# Patient Record
Sex: Female | Born: 2000
Health system: Southern US, Academic
[De-identification: ages and names within clinical notes are randomized; demographics above are authoritative.]

## PROBLEM LIST (undated history)

## (undated) ENCOUNTER — Ambulatory Visit

## (undated) ENCOUNTER — Ambulatory Visit: Payer: MEDICARE | Attending: Optometrist | Primary: Optometrist

## (undated) ENCOUNTER — Encounter: Attending: Dermatology | Primary: Dermatology

## (undated) ENCOUNTER — Ambulatory Visit: Payer: MEDICARE

## (undated) ENCOUNTER — Telehealth

## (undated) ENCOUNTER — Ambulatory Visit: Attending: Family Medicine | Primary: Family Medicine

## (undated) ENCOUNTER — Encounter: Payer: MEDICARE | Attending: Dermatology | Primary: Dermatology

## (undated) ENCOUNTER — Encounter: Attending: Family Medicine | Primary: Family Medicine

## (undated) ENCOUNTER — Encounter: Payer: MEDICARE | Attending: Pediatrics | Primary: Pediatrics

## (undated) ENCOUNTER — Encounter: Payer: MEDICARE | Attending: Family Medicine | Primary: Family Medicine

## (undated) ENCOUNTER — Encounter

## (undated) ENCOUNTER — Ambulatory Visit: Payer: MEDICARE | Attending: Family | Primary: Family

## (undated) ENCOUNTER — Encounter: Attending: Family | Primary: Family

## (undated) ENCOUNTER — Encounter: Payer: MEDICARE | Attending: Obstetrics & Gynecology | Primary: Obstetrics & Gynecology

## (undated) ENCOUNTER — Ambulatory Visit: Payer: MEDICARE | Attending: Pediatrics | Primary: Pediatrics

## (undated) ENCOUNTER — Encounter: Payer: MEDICARE | Attending: Optometrist | Primary: Optometrist

## (undated) ENCOUNTER — Ambulatory Visit: Payer: MEDICARE | Attending: Dermatology | Primary: Dermatology

## (undated) DIAGNOSIS — F79 Unspecified intellectual disabilities: Secondary | ICD-10-CM

## (undated) HISTORY — PX: NO PAST SURGERIES: SHX2092

---
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
## 2019-09-12 ENCOUNTER — Ambulatory Visit: Admit: 2019-09-12 | Discharge: 2019-09-13 | Payer: MEDICARE | Attending: Family Medicine | Primary: Family Medicine

## 2019-09-12 DIAGNOSIS — R5383 Other fatigue: Principal | ICD-10-CM

## 2019-09-13 MED ORDER — FERROUS SULFATE 325 MG (65 MG IRON) TABLET
ORAL_TABLET | ORAL | 3 refills | 90 days | Status: CP
Start: 2019-09-13 — End: 2020-09-12

## 2019-09-29 ENCOUNTER — Encounter: Admit: 2019-09-29 | Discharge: 2019-09-30 | Payer: MEDICARE | Attending: Family Medicine | Primary: Family Medicine

## 2019-12-11 ENCOUNTER — Encounter: Payer: Self-pay | Admitting: Emergency Medicine

## 2019-12-11 ENCOUNTER — Other Ambulatory Visit: Payer: Self-pay

## 2019-12-11 ENCOUNTER — Ambulatory Visit
Admission: EM | Admit: 2019-12-11 | Discharge: 2019-12-11 | Disposition: A | Discharge status: Home / Self Care | Payer: Medicare Other | Attending: Emergency Medicine | Admitting: Emergency Medicine

## 2019-12-11 DIAGNOSIS — S0501XA Injury of conjunctiva and corneal abrasion without foreign body, right eye, initial encounter: Secondary | ICD-10-CM

## 2019-12-11 MED ORDER — ERYTHROMYCIN 5 MG/GM OP OINT: TOPICAL_OINTMENT | Freq: Four times a day (QID) | OPHTHALMIC | 0 refills | Status: AC

## 2019-12-11 NOTE — ED Provider Notes (Signed)
[facility] [place] [facility] [place]   Name: Miranda Jenkins DOB: 18-Apr-2001 MRN: [id number] CSN: [id number] PCP: Patient, No Pcp Per  Arrival date and time:  12/11/19 4259  Chief Complaint:  Eye Injury (right)   NOTE: Prior to seeing the patient today, I have reviewed the triage nursing documentation and vital signs. Clinical staff has updated patient's PMH/PSHx, current medication list, and drug allergies/intolerances to ensure comprehensive history available to assist in medical decision making.   History:   HPI: Miranda Jenkins is a 19 y.o. female who presents today with complaints of right eye pain.  Patient was at the nail salon getting her nails done and when she went to wash her hands, she accidentally scratched her right eye with her sharp fingernail.  She immediately felt pain to her that I and noticed the swelling to her eyelid continue throughout the day.  She is also noticed some small purulent drainage to that I and occasional blurred vision.  She denies any loss of vision or headaches.   History reviewed. No pertinent past medical history.  Past Surgical History:  Procedure Laterality Date   NO PAST SURGERIES      Family History  Problem Relation Age of Onset   Other Mother        unknown medical history   Other Father        unknown medical history    Social History   Tobacco Use   Smoking status: Never Smoker   Smokeless tobacco: Never Used  Vaping Use   Vaping Use: Never used  Substance Use Topics   Alcohol use: Never   Drug use: Never    There are no problems to display for this patient.   Home Medications:    No outpatient medications have been marked as taking for the 12/11/19 encounter [facility] Encounter).    Allergies:   Shellfish allergy  Review of Systems (ROS): Review of Systems  Eyes: Positive for pain and discharge. Negative for photophobia and visual disturbance.  All other systems reviewed and are negative.    Vital  Signs: Today's Vitals   12/11/19 0955  BP: (!) 131/93  Pulse: 93  Resp: 18  Temp: 98.2 F (36.8 C)  TempSrc: Oral  SpO2: 100%  Weight: 109 lb (49.4 kg)  Height: 5\' 3"  (1.6 m)  PainSc: 0-No pain    Physical Exam: Physical Exam Vitals and nursing note reviewed.  Constitutional:      Appearance: Normal appearance.  Eyes:     General:        Right eye: Discharge present. No foreign body.     Extraocular Movements: Extraocular movements intact.     Conjunctiva/sclera:     Right eye: Right conjunctiva is injected. No hemorrhage.  Cardiovascular:     Rate and Rhythm: Normal rate and regular rhythm.     Pulses: Normal pulses.     Heart sounds: Normal heart sounds.  Pulmonary:     Breath sounds: Normal breath sounds.  Skin:    General: Skin is warm and dry.  Neurological:     Mental Status: She is alert.  Psychiatric:        Mood and Affect: Mood normal.        Behavior: Behavior normal.        Thought Content: Thought content normal.      Urgent Care Treatments / Results:   LABS: PLEASE NOTE: all labs that were ordered this encounter are listed, however only abnormal results  are displayed. Labs Reviewed - No data to display  EKG: -None  RADIOLOGY: No results found.  PROCEDURES: Procedures  MEDICATIONS RECEIVED THIS VISIT: Medications - No data to display  PERTINENT CLINICAL COURSE NOTES/UPDATES:   Initial Impression / Assessment and Plan / Urgent Care Course:  Pertinent labs & imaging results that were available during my care of the patient were personally reviewed by me and considered in my medical decision making (see lab/imaging section of note for values and interpretations).  Miranda Jenkins is a 19 y.o. female who presents to [facility] today with complaints of right eye pain, diagnosed with right corneal abrasion, and treated as such with the medications below. NP and patient reviewed discharge instructions below during visit.   Patient is  well appearing overall in clinic today. She does not appear to be in any acute distress. Presenting symptoms (see HPI) and exam as documented above.   I have reviewed the follow up and strict return precautions for any new or worsening symptoms. Patient is aware of symptoms that would be deemed urgent/emergent, and would thus require further evaluation either here or in the emergency department. At the time of discharge, she verbalized understanding and consent with the discharge plan as it was reviewed with her. All questions were fielded by provider and/or clinic staff prior to patient discharge.    Final Clinical Impressions / Urgent Care Diagnoses:   Final diagnoses:  Abrasion of right cornea, initial encounter    New Prescriptions:  [place] Controlled Substance Registry consulted? Not Applicable  Meds ordered this encounter  Medications   erythromycin ophthalmic ointment    Sig: Place into the right eye 4 (four) times daily for 7 days. Place a 1/2 inch ribbon of ointment into the lower eyelid.    Dispense:  1 g    Refill:  0      Discharge Instructions     You were seen for eye pain and are being treated for a corneal abrasion.   Take your medications as prescribed.  Make sure you follow-up if your symptoms do not resolve or if they worsen.  Keep your eye clean and do not touch your eye if possible.  --- Take care, Dr. Sharlet Salina, NP-c     Recommended Follow up Care:  Patient encouraged to follow up with the following provider within the specified time frame, or sooner as dictated by the severity of her symptoms. As always, she was instructed that for any urgent/emergent care needs, she should seek care either here or in the emergency department for more immediate evaluation.   Bailey Mech, DNP, NP-c    Bailey Mech, NP 12/11/19 1026

## 2019-12-11 NOTE — ED Triage Notes (Signed)
Patient in today c/o right eye injury yesterday when she poked herself in her eye with her fingernail.

## 2019-12-11 NOTE — Discharge Instructions (Signed)
You were seen for eye pain and are being treated for a corneal abrasion.   Take your medications as prescribed.  Make sure you follow-up if your symptoms do not resolve or if they worsen.  Keep your eye clean and do not touch your eye if possible.  --- Take care, Dr. Sharlet Salina, NP-c

## 2021-01-22 ENCOUNTER — Emergency Department: Payer: Medicare Other

## 2021-01-22 ENCOUNTER — Emergency Department
Admission: EM | Admit: 2021-01-22 | Discharge: 2021-01-22 | Disposition: A | Discharge status: Home / Self Care | Payer: Medicare Other | Attending: Emergency Medicine | Admitting: Emergency Medicine

## 2021-01-22 ENCOUNTER — Other Ambulatory Visit: Payer: Self-pay

## 2021-01-22 DIAGNOSIS — S8991XA Unspecified injury of right lower leg, initial encounter: Secondary | ICD-10-CM | POA: Diagnosis present

## 2021-01-22 DIAGNOSIS — X509XXA Other and unspecified overexertion or strenuous movements or postures, initial encounter: Secondary | ICD-10-CM | POA: Insufficient documentation

## 2021-01-22 DIAGNOSIS — S83004A Unspecified dislocation of right patella, initial encounter: Secondary | ICD-10-CM | POA: Insufficient documentation

## 2021-01-22 DIAGNOSIS — Y92009 Unspecified place in unspecified non-institutional (private) residence as the place of occurrence of the external cause: Secondary | ICD-10-CM | POA: Insufficient documentation

## 2021-01-22 NOTE — ED Triage Notes (Signed)
Patient presents from home after sitting on the bed and her right knee "popped" out of place. Pt received of fentanyl PTA by EMS Patient denies any other sx at this time.

## 2021-01-22 NOTE — ED Provider Notes (Signed)
[facility]-EMERGENCY DEPARTMENT  ____________________________________________  Time seen: Approximately 10:37 PM  I have reviewed the triage vital signs and the nursing notes.   HISTORY  Chief Complaint Knee Pain (right)   Historian Patient     HPI Miranda Jenkins is a 20 y.o. female presents to the emergency department with acute right knee pain.  Patient reports that she went to sit on her bed and her kneecap popped out of place.  Patient denies ever needing patella reduction in the past.  No other trauma months reported at home.  No other alleviating measures have been attempted.   No past medical history on file.   Immunizations up to date:  Yes.     No past medical history on file.  There are no problems to display for this patient.   Past Surgical History:  Procedure Laterality Date   NO PAST SURGERIES      Prior to Admission medications   Not on File    Allergies Shellfish allergy  Family History  Problem Relation Age of Onset   Other Mother        unknown medical history   Other Father        unknown medical history    Social History Social History   Tobacco Use   Smoking status: Never   Smokeless tobacco: Never  Vaping Use   Vaping Use: Never used  Substance Use Topics   Alcohol use: Never   Drug use: Never     Review of Systems  Constitutional: No fever/chills Eyes:  No discharge ENT: No upper respiratory complaints. Respiratory: no cough. No SOB/ use of accessory muscles to breath Gastrointestinal:   No nausea, no vomiting.  No diarrhea.  No constipation. Musculoskeletal: Patient has right knee pain.  Skin: Negative for rash, abrasions, lacerations, ecchymosis.    ____________________________________________   PHYSICAL EXAM:  VITAL SIGNS: ED Triage Vitals  Enc Vitals Group     BP 01/22/21 2227 131/85     Pulse Rate 01/22/21 2227 84     Resp 01/22/21 2227 18     Temp 01/22/21 2227 98.2 F (36.8 C)     Temp Source  01/22/21 2227 Oral     SpO2 01/22/21 2227 100 %     Weight 01/22/21 2226 115 lb (52.2 kg)     Height 01/22/21 2226 5\' 3"  (1.6 m)     Head Circumference --      Peak Flow --      Pain Score 01/22/21 2227 0     Pain Loc --      Pain Edu? --      Excl. in GC? --      Constitutional: Alert and oriented. Well appearing and in no acute distress. Eyes: Conjunctivae are normal. PERRL. EOMI. Head: Atraumatic. ENT: Cardiovascular: Normal rate, regular rhythm. Normal S1 and S2.  Good peripheral circulation. Respiratory: Normal respiratory effort without tachypnea or retractions. Lungs CTAB. Good air entry to the bases with no decreased or absent breath sounds Gastrointestinal: Bowel sounds x 4 quadrants. Soft and nontender to palpation. No guarding or rigidity. No distention. Musculoskeletal: Full range of motion to all extremities.  Patient had right knee deformity with evidence of dislocated patella.  Patella was easily reducible.  Palpable dorsalis pedis pulse bilaterally and symmetrically.  Capillary refill less than 2 seconds on the right. Neurologic:  Normal for age. No gross focal neurologic deficits are appreciated.  Skin:  Skin is warm, dry and intact. No rash noted. Psychiatric: Mood  and affect are normal for age. Speech and behavior are normal.   ____________________________________________   LABS (all labs ordered are listed, but only abnormal results are displayed)  Labs Reviewed - No data to display ____________________________________________  EKG   ____________________________________________  RADIOLOGY Geraldo Pitter, personally viewed and evaluated these images (plain radiographs) as part of my medical decision making, as well as reviewing the written report by the radiologist.    DG Knee Complete 4 Views Right  Result Date: 01/22/2021 CLINICAL DATA:  Dislocated patella.  Post reduction films. EXAM: RIGHT KNEE - COMPLETE 4+ VIEW COMPARISON:  None. FINDINGS:  Normal alignment and joint spaces. Patella appears normally located on provided views. No evidence of fracture. There may be a small knee joint effusion. Mild anterior soft tissue edema. IMPRESSION: 1. No fracture.  Normal alignment. 2. Small knee joint effusion with anterior soft tissue edema. Electronically Signed   By: Narda Rutherford M.D.   On: 01/22/2021 22:45    ____________________________________________    PROCEDURES  Procedure(s) performed:     Reduction of dislocation  Date/Time: 01/22/2021 10:39 PM Performed by: Orvil Feil, PA-C Authorized by: Orvil Feil, PA-C  Consent: Verbal consent obtained. Written consent obtained. Risks and benefits: risks, benefits and alternatives were discussed Consent given by: patient Patient understanding: patient states understanding of the procedure being performed Patient identity confirmed: verbally with patient Time out: Immediately prior to procedure a "time out" was called to verify the correct patient, procedure, equipment, support staff and site/side marked as required. Local anesthesia used: no  Anesthesia: Local anesthesia used: no  Sedation: Patient sedated: no  Patient tolerance: patient tolerated the procedure well with no immediate complications       Medications - No data to display   ____________________________________________   INITIAL IMPRESSION / ASSESSMENT AND PLAN / ED COURSE  Pertinent labs & imaging results that were available during my care of the patient were reviewed by me and considered in my medical decision making (see chart for details).    Assessment and Plan:  Patella dislocation 20 year old female presents to the emergency department with right knee pain and evidence of a dislocated patella.  Patella was easily reduced in the emergency department.  Patient was placed in a knee immobilizer and crutches were provided.  Tylenol and ibuprofen alternating were recommended for  discomfort.  Patient was advised to follow-up with orthopedics, Dr. Rosita Kea.     ____________________________________________  FINAL CLINICAL IMPRESSION(S) / ED DIAGNOSES  Final diagnoses:  Dislocation of right patella, initial encounter      NEW MEDICATIONS STARTED DURING THIS VISIT:  ED Discharge Orders     None           This chart was dictated using voice recognition software/Dragon. Despite best efforts to proofread, errors can occur which can change the meaning. Any change was purely unintentional.     Orvil Feil, PA-C 01/22/21 2259    Willy Eddy, MD 01/23/21 1041

## 2021-01-22 NOTE — Discharge Instructions (Addendum)
You can take Tylenol and ibuprofen alternating for pain.

## 2021-01-22 NOTE — ED Notes (Signed)
E-signature pad unavailable - Pt verbalized understanding of D/C information - no additional concerns at this time.  

## 2021-10-17 ENCOUNTER — Ambulatory Visit: Admit: 2021-10-17 | Discharge: 2021-10-18 | Payer: MEDICARE

## 2021-10-17 DIAGNOSIS — Z0000 Encounter for general adult medical examination without abnormal findings: Principal | ICD-10-CM

## 2021-10-17 DIAGNOSIS — Z130 Encounter for screening for diseases of the blood and blood-forming organs and certain disorders involving the immune mechanism: Principal | ICD-10-CM

## 2021-10-17 DIAGNOSIS — Z13220 Encounter for screening for lipoid disorders: Principal | ICD-10-CM

## 2021-10-17 DIAGNOSIS — L309 Dermatitis, unspecified: Principal | ICD-10-CM

## 2021-10-17 DIAGNOSIS — Z87828 Personal history of other (healed) physical injury and trauma: Principal | ICD-10-CM

## 2021-10-17 DIAGNOSIS — Z1329 Encounter for screening for other suspected endocrine disorder: Principal | ICD-10-CM

## 2022-01-01 ENCOUNTER — Encounter: Payer: Self-pay | Admitting: Emergency Medicine

## 2022-01-01 ENCOUNTER — Other Ambulatory Visit: Payer: Self-pay

## 2022-01-01 ENCOUNTER — Emergency Department
Admission: EM | Admit: 2022-01-01 | Discharge: 2022-01-01 | Disposition: A | Discharge status: Home / Self Care | Payer: Medicare Other | Attending: Emergency Medicine | Admitting: Emergency Medicine

## 2022-01-01 DIAGNOSIS — Z046 Encounter for general psychiatric examination, requested by authority: Secondary | ICD-10-CM | POA: Diagnosis present

## 2022-01-01 DIAGNOSIS — F4389 Other reactions to severe stress: Secondary | ICD-10-CM | POA: Diagnosis not present

## 2022-01-01 DIAGNOSIS — F430 Acute stress reaction: Secondary | ICD-10-CM | POA: Diagnosis not present

## 2022-01-01 DIAGNOSIS — I10 Essential (primary) hypertension: Secondary | ICD-10-CM | POA: Insufficient documentation

## 2022-01-01 DIAGNOSIS — Z20822 Contact with and (suspected) exposure to covid-19: Secondary | ICD-10-CM | POA: Diagnosis not present

## 2022-01-01 DIAGNOSIS — R4588 Nonsuicidal self-harm: Secondary | ICD-10-CM | POA: Diagnosis present

## 2022-01-01 DIAGNOSIS — F69 Unspecified disorder of adult personality and behavior: Secondary | ICD-10-CM

## 2022-01-01 HISTORY — DX: Unspecified intellectual disabilities: F79

## 2022-01-01 LAB — URINE DRUG SCREEN, QUALITATIVE (ARMC ONLY)
Amphetamines, Ur Screen: NOT DETECTED
Barbiturates, Ur Screen: NOT DETECTED
Benzodiazepine, Ur Scrn: NOT DETECTED
Cannabinoid 50 Ng, Ur ~~LOC~~: NOT DETECTED
Cocaine Metabolite,Ur ~~LOC~~: NOT DETECTED
MDMA (Ecstasy)Ur Screen: NOT DETECTED
Methadone Scn, Ur: NOT DETECTED
Opiate, Ur Screen: NOT DETECTED
Phencyclidine (PCP) Ur S: NOT DETECTED
Tricyclic, Ur Screen: NOT DETECTED

## 2022-01-01 LAB — CBC
HCT: 40.5 % (ref 36.0–46.0)
Hemoglobin: 12.5 g/dL (ref 12.0–15.0)
MCH: 24.6 pg — ABNORMAL LOW (ref 26.0–34.0)
MCHC: 30.9 g/dL (ref 30.0–36.0)
MCV: 79.6 fL — ABNORMAL LOW (ref 80.0–100.0)
Platelets: 278 10*3/uL (ref 150–400)
RBC: 5.09 MIL/uL (ref 3.87–5.11)
RDW: 14.4 % (ref 11.5–15.5)
WBC: 3.9 10*3/uL — ABNORMAL LOW (ref 4.0–10.5)
nRBC: 0 % (ref 0.0–0.2)

## 2022-01-01 LAB — COMPREHENSIVE METABOLIC PANEL
ALT: 12 U/L (ref 0–44)
AST: 17 U/L (ref 15–41)
Albumin: 4.6 g/dL (ref 3.5–5.0)
Alkaline Phosphatase: 41 U/L (ref 38–126)
Anion gap: 6 (ref 5–15)
BUN: 13 mg/dL (ref 6–20)
CO2: 25 mmol/L (ref 22–32)
Calcium: 9.8 mg/dL (ref 8.9–10.3)
Chloride: 108 mmol/L (ref 98–111)
Creatinine, Ser: 0.46 mg/dL (ref 0.44–1.00)
GFR, Estimated: >60 mL/min (ref >60)
Glucose, Bld: 86 mg/dL (ref 70–99)
Potassium: 3.9 mmol/L (ref 3.5–5.1)
Sodium: 139 mmol/L (ref 135–145)
Total Bilirubin: 1.1 mg/dL (ref 0.3–1.2)
Total Protein: 8.1 g/dL (ref 6.5–8.1)

## 2022-01-01 LAB — ETHANOL: Alcohol, Ethyl (B): <10 mg/dL (ref <10)

## 2022-01-01 LAB — POC URINE PREG, ED: Preg Test, Ur: NEGATIVE

## 2022-01-01 LAB — RESP PANEL BY RT-PCR (FLU A&B, COVID) ARPGX2
Influenza A by PCR: NEGATIVE
Influenza B by PCR: NEGATIVE
SARS Coronavirus 2 by RT PCR: NEGATIVE
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
## 2022-01-01 NOTE — ED Provider Notes (Signed)
-----------------------------------------   3:52 PM on 01/01/2022 ----------------------------------------- Patient has been seen and evaluated by psychiatry.  Psychiatry believes the patient is safe for discharge home.  Mother will be coming to pick the patient up shortly.  Patient's medical work-up is nonrevealing with a reassuring lab work, negative urine drug screen, negative pregnancy test, negative COVID/flu reassuring CBC and chemistry.   Minna Antis, MD 01/01/22 [phone]
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
## 2022-01-01 NOTE — ED Notes (Signed)
Pt given d/c paperwork and explained about resources. Pt understood, no other needs expressed. Pt discharge signature placed in medical records bin

## 2022-01-01 NOTE — Discharge Instructions (Addendum)
Contact patient's insurance or primary care provider for referrals to appropriate therapists/counseling centers. Another resource is the website  "psychologytoday.com" where they list providers that take your insurance. [employer] [facility] [phone]. You can also return to [facility]
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
## 2022-01-01 NOTE — ED Notes (Signed)
Patient Items:  Black Sandals Pink Underpants Jeans [employer]
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
## 2022-02-14 ENCOUNTER — Emergency Department
Admission: EM | Admit: 2022-02-14 | Discharge: 2022-02-16 | Disposition: A | Discharge status: Home / Self Care | Payer: Medicare Other | Attending: Emergency Medicine | Admitting: Emergency Medicine

## 2022-02-14 ENCOUNTER — Encounter: Payer: Self-pay | Admitting: Emergency Medicine

## 2022-02-14 ENCOUNTER — Other Ambulatory Visit: Payer: Self-pay

## 2022-02-14 DIAGNOSIS — F4325 Adjustment disorder with mixed disturbance of emotions and conduct: Secondary | ICD-10-CM | POA: Diagnosis not present

## 2022-02-14 DIAGNOSIS — Z79899 Other long term (current) drug therapy: Secondary | ICD-10-CM | POA: Diagnosis not present

## 2022-02-14 DIAGNOSIS — F430 Acute stress reaction: Secondary | ICD-10-CM | POA: Diagnosis present

## 2022-02-14 DIAGNOSIS — F3481 Disruptive mood dysregulation disorder: Secondary | ICD-10-CM | POA: Insufficient documentation

## 2022-02-14 DIAGNOSIS — F29 Unspecified psychosis not due to a substance or known physiological condition: Secondary | ICD-10-CM | POA: Insufficient documentation

## 2022-02-14 DIAGNOSIS — Y30XXXA Falling, jumping or pushed from a high place, undetermined intent, initial encounter: Secondary | ICD-10-CM | POA: Diagnosis not present

## 2022-02-14 DIAGNOSIS — S90812A Abrasion, left foot, initial encounter: Secondary | ICD-10-CM

## 2022-02-14 DIAGNOSIS — Z20822 Contact with and (suspected) exposure to covid-19: Secondary | ICD-10-CM | POA: Insufficient documentation

## 2022-02-14 DIAGNOSIS — S99922A Unspecified injury of left foot, initial encounter: Secondary | ICD-10-CM | POA: Diagnosis present

## 2022-02-14 LAB — COMPREHENSIVE METABOLIC PANEL
ALT: 11 U/L (ref 0–44)
AST: 17 U/L (ref 15–41)
Albumin: 4.5 g/dL (ref 3.5–5.0)
Alkaline Phosphatase: 40 U/L (ref 38–126)
Anion gap: 11 (ref 5–15)
BUN: 11 mg/dL (ref 6–20)
CO2: 24 mmol/L (ref 22–32)
Calcium: 9.9 mg/dL (ref 8.9–10.3)
Chloride: 104 mmol/L (ref 98–111)
Creatinine, Ser: 0.63 mg/dL (ref 0.44–1.00)
GFR, Estimated: >60 mL/min (ref >60)
Glucose, Bld: 93 mg/dL (ref 70–99)
Potassium: 3.4 mmol/L — ABNORMAL LOW (ref 3.5–5.1)
Sodium: 139 mmol/L (ref 135–145)
Total Bilirubin: 1 mg/dL (ref 0.3–1.2)
Total Protein: 8.1 g/dL (ref 6.5–8.1)

## 2022-02-14 LAB — CBC
HCT: 42.3 % (ref 36.0–46.0)
Hemoglobin: 13 g/dL (ref 12.0–15.0)
MCH: 24.3 pg — ABNORMAL LOW (ref 26.0–34.0)
MCHC: 30.7 g/dL (ref 30.0–36.0)
MCV: 79.2 fL — ABNORMAL LOW (ref 80.0–100.0)
Platelets: 237 10*3/uL (ref 150–400)
RBC: 5.34 MIL/uL — ABNORMAL HIGH (ref 3.87–5.11)
RDW: 14.1 % (ref 11.5–15.5)
WBC: 4.5 10*3/uL (ref 4.0–10.5)
nRBC: 0 % (ref 0.0–0.2)

## 2022-02-14 LAB — SALICYLATE LEVEL: Salicylate Lvl: <7 mg/dL — ABNORMAL LOW (ref 7.0–30.0)

## 2022-02-14 LAB — ACETAMINOPHEN LEVEL: Acetaminophen (Tylenol), Serum: <10 ug/mL — ABNORMAL LOW (ref 10–30)

## 2022-02-14 LAB — RESP PANEL BY RT-PCR (FLU A&B, COVID) ARPGX2
Influenza A by PCR: NEGATIVE
Influenza B by PCR: NEGATIVE
SARS Coronavirus 2 by RT PCR: NEGATIVE

## 2022-02-14 LAB — ETHANOL: Alcohol, Ethyl (B): <10 mg/dL (ref <10)

## 2022-02-14 MED ORDER — BACITRACIN ZINC 500 UNIT/GM EX OINT: TOPICAL_OINTMENT | Freq: Two times a day (BID) | CUTANEOUS | Status: DC

## 2022-02-14 NOTE — ED Notes (Signed)
Report to include Situation, Background, Assessment, and Recommendations received from April RN. Patient alert and oriented, warm and dry, in no acute distress. Patient denies SI, HI, AVH and pain. Patient made aware of Q15 minute rounds and security cameras for their safety. Patient instructed to come to me with needs or concerns. 
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
## 2022-02-14 NOTE — ED Notes (Signed)
Report to hewan, rn 
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
## 2022-02-14 NOTE — ED Notes (Signed)
Patient's mother is Rosine Door and her number is [phone]. Pt mother requests to be updated when possible. Mother is also requesting to speak with the doctor and psych team via phone when possible. Mother had to go home to care for elderly mother.

## 2022-02-14 NOTE — ED Notes (Signed)
Pt dressed out into hospital scrubs with this EDT, EDT Kiyah and Paramedic Student Kara Mead.  Black Slides  Tie dye shorts  Tie dye tank top  Blue Bra  Red underwear  Black hair tie

## 2022-02-15 DIAGNOSIS — F430 Acute stress reaction: Secondary | ICD-10-CM | POA: Diagnosis not present

## 2022-02-15 DIAGNOSIS — S90812A Abrasion, left foot, initial encounter: Secondary | ICD-10-CM | POA: Diagnosis not present

## 2022-02-15 LAB — URINE DRUG SCREEN, QUALITATIVE (ARMC ONLY)
Amphetamines, Ur Screen: NOT DETECTED
Barbiturates, Ur Screen: NOT DETECTED
Benzodiazepine, Ur Scrn: NOT DETECTED
Cannabinoid 50 Ng, Ur ~~LOC~~: NOT DETECTED
Cocaine Metabolite,Ur ~~LOC~~: NOT DETECTED
MDMA (Ecstasy)Ur Screen: NOT DETECTED
Methadone Scn, Ur: NOT DETECTED
Opiate, Ur Screen: NOT DETECTED
Phencyclidine (PCP) Ur S: NOT DETECTED
Tricyclic, Ur Screen: NOT DETECTED

## 2022-02-15 LAB — PREGNANCY, URINE: Preg Test, Ur: NEGATIVE

## 2022-02-15 MED ORDER — DIVALPROEX SODIUM 250 MG PO DR TAB
250.0000 mg | DELAYED_RELEASE_TABLET | Freq: Two times a day (BID) | ORAL | Status: DC
  Administered 2022-02-15 – 2022-02-16 (×3): 250 mg via ORAL
  Filled 2022-02-15 (×3): qty 1

## 2022-02-15 MED ORDER — RISPERIDONE 1 MG PO TABS
0.5000 mg | ORAL_TABLET | Freq: Two times a day (BID) | ORAL | Status: DC
  Administered 2022-02-15 – 2022-02-16 (×3): 0.5 mg via ORAL
  Filled 2022-02-15 (×3): qty 1

## 2022-02-15 NOTE — ED Notes (Signed)
Mother called for update. No update available at this time. Will update her once [facility] team has evaluated pt,.
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
## 2022-02-15 NOTE — ED Notes (Signed)
VOL per NP Mickie Bail  patient does not meet inpatient admit

## 2022-02-15 NOTE — ED Notes (Signed)
Snack and beverage given. 
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
## 2022-02-15 NOTE — ED Notes (Signed)
Report to include Situation, Background, Assessment, and Recommendations received from Crystal RN. Patient alert and oriented, warm and dry, in no acute distress. Patient denies SI, HI, AVH and pain. Patient made aware of Q15 minute rounds and security cameras for their safety. Patient instructed to come to me with needs or concerns.  

## 2022-02-16 DIAGNOSIS — S90812A Abrasion, left foot, initial encounter: Secondary | ICD-10-CM | POA: Diagnosis not present

## 2022-02-16 DIAGNOSIS — F430 Acute stress reaction: Secondary | ICD-10-CM | POA: Diagnosis not present

## 2022-02-16 MED ORDER — RISPERIDONE 0.5 MG PO TABS: 0.5000 mg | ORAL_TABLET | Freq: Two times a day (BID) | ORAL | 0 refills | Status: AC

## 2022-02-16 MED ORDER — DIVALPROEX SODIUM 250 MG PO DR TAB: 250.0000 mg | DELAYED_RELEASE_TABLET | Freq: Two times a day (BID) | ORAL | 0 refills | Status: AC

## 2022-02-16 NOTE — ED Notes (Signed)
Patient is stable in NAD. She is discharged to home via mother. Discharged instruction reviewed and patient and mother verbalized understanding. Belongings given to patient. No issues.

## 2022-02-16 NOTE — TOC Progression Note (Addendum)
Transition of Care [facility]) - Progression Note    Patient Details  Name: Miranda Jenkins MRN: [id number] Date of Birth: 2001/05/10  [facility]) CM/SW [place], LCSW Phone Number: 02/16/2022, 2:19 PM  Clinical Narrative:    Family Meeting with patient's Mother Wilfred Curtis) present via phone, Psych NP Theodoro Clock, and [place].  Chanda explained her concerns about patient's behaviors. NP and TTS CSW explained why patient does not meet criteria for Inpatient Hospital admission and that medications have been started to help with behaviors.  This CSW explained that we want to ensure there is a safe plan that [facility] and patient are comfortable with. Resources have been provided for [facility] to assist with group home placement, outpatient Counseling/Therapy resources, and [place] group home list.  Wilfred Curtis stated she did get in touch with [facility] and plans to call them back this evening with patient present as they wanted to speak with the patient directly.  Wilfred Curtis stated she chooses to come pick patient up around 8:30 pm.  Notified ED MD and RN of TOC assessment.  Confirmed with Psych NP that patient is stable for DC home with Mother prior to group home placement being completed.   TOC Director updated.    Expected Discharge Plan: Group Home Barriers to Discharge: Continued Medical Work up  Expected Discharge Plan and Services Expected Discharge Plan: Group Home       Living arrangements for the past 2 months: Single Family Home                                       Social Determinants of Health (SDOH) Interventions    Readmission Risk Interventions     No data to display

## 2022-02-16 NOTE — ED Notes (Signed)
Plan for pt is to DC with mother tonight at 20PM per social work.
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
## 2022-02-16 NOTE — ED Provider Notes (Signed)
Emergency Medicine Observation Re-evaluation Note  Miranda Jenkins is a 21 y.o. female, seen on rounds today.  Pt initially presented to the ED for complaints of Psychiatric Evaluation   Physical Exam  BP 131/79   Pulse 98   Temp 98 F (36.7 C)   Resp 18   Ht 5\' 3"  (1.6 m)   Wt 47.2 kg   LMP 01/20/2022 (Exact Date)   SpO2 100%   BMI 18.42 kg/m  Physical Exam General: NAD  ED Course / MDM  EKG:   I have reviewed the labs performed to date as well as medications administered while in observation.  Recent changes in the last 24 hours include none.  Plan  Current plan is for psych.    Merlyn Lot, MD 02/16/22 [phone]

## 2022-02-16 NOTE — ED Provider Notes (Signed)
The patient has been evaluated at bedside by  psychiatry.  Patient is clinically stable.  Not felt to be a danger to self or others.  No SI or Hi.  No indication for inpatient psychiatric admission at this time.  Appropriate for continued outpatient therapy.    Merlyn Lot, MD 02/16/22 [phone]
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
## 2022-02-16 NOTE — ED Notes (Signed)
VOL/pending reassessment 

## 2022-02-16 NOTE — BH Assessment (Signed)
Spoke with patient's mother and informed her she is psych cleared and will be discharging. The mother said she was unable to get her till after 7pm, because she was at work and work 12-hour shifts.

## 2022-02-16 NOTE — ED Notes (Signed)
Patient is IVC pending re-evaluation
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
## 2022-02-16 NOTE — ED Notes (Signed)
Report to include Situation, Background, Assessment, and Recommendations received from Crystal RN. Patient alert and oriented, warm and dry, in no acute distress. Patient denies SI, HI, AVH and pain. Patient made aware of Q15 minute rounds and security cameras for their safety. Patient instructed to come to me with needs or concerns.  

## 2022-02-16 NOTE — ED Notes (Signed)
Pt provided with dinner tray.
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
## 2023-01-15 ENCOUNTER — Ambulatory Visit: Admit: 2023-01-15 | Discharge: 2023-01-16 | Payer: MEDICARE

## 2023-01-15 DIAGNOSIS — Z130 Encounter for screening for diseases of the blood and blood-forming organs and certain disorders involving the immune mechanism: Principal | ICD-10-CM

## 2023-01-15 DIAGNOSIS — Z13220 Encounter for screening for lipoid disorders: Principal | ICD-10-CM

## 2023-01-15 DIAGNOSIS — F70 Mild intellectual disabilities: Principal | ICD-10-CM

## 2023-01-15 DIAGNOSIS — L309 Dermatitis, unspecified: Principal | ICD-10-CM

## 2023-01-15 DIAGNOSIS — Z1329 Encounter for screening for other suspected endocrine disorder: Principal | ICD-10-CM

## 2023-01-15 DIAGNOSIS — Z0000 Encounter for general adult medical examination without abnormal findings: Principal | ICD-10-CM

## 2023-02-19 ENCOUNTER — Ambulatory Visit: Admit: 2023-02-19 | Discharge: 2023-02-20 | Payer: MEDICARE

## 2023-02-19 DIAGNOSIS — W503XXA Accidental bite by another person, initial encounter: Principal | ICD-10-CM

## 2023-02-19 DIAGNOSIS — Z124 Encounter for screening for malignant neoplasm of cervix: Principal | ICD-10-CM

## 2023-02-19 MED ORDER — AMOXICILLIN 875 MG-POTASSIUM CLAVULANATE 125 MG TABLET
ORAL_TABLET | Freq: Two times a day (BID) | ORAL | 0 refills | 10 days | Status: CP
Start: 2023-02-19 — End: 2023-03-01

## 2024-07-01 ENCOUNTER — Encounter: Admit: 2024-07-01 | Discharge: 2024-07-01 | Payer: MEDICARE | Attending: Family | Primary: Family

## 2024-07-01 DIAGNOSIS — Z0000 Encounter for general adult medical examination without abnormal findings: Secondary | ICD-10-CM

## 2024-07-01 DIAGNOSIS — R5383 Other fatigue: Secondary | ICD-10-CM

## 2024-07-01 DIAGNOSIS — L309 Dermatitis, unspecified: Secondary | ICD-10-CM

## 2024-07-01 DIAGNOSIS — Z130 Encounter for screening for diseases of the blood and blood-forming organs and certain disorders involving the immune mechanism: Secondary | ICD-10-CM

## 2024-07-01 DIAGNOSIS — Z1329 Encounter for screening for other suspected endocrine disorder: Principal | ICD-10-CM

## 2024-07-01 DIAGNOSIS — Z13220 Encounter for screening for lipoid disorders: Secondary | ICD-10-CM

## 2024-07-01 DIAGNOSIS — F70 Mild intellectual disabilities: Secondary | ICD-10-CM

## 2024-07-01 DIAGNOSIS — Z136 Encounter for screening for cardiovascular disorders: Secondary | ICD-10-CM

## 2024-07-05 DIAGNOSIS — D509 Iron deficiency anemia, unspecified: Secondary | ICD-10-CM

## 2024-07-05 DIAGNOSIS — D72819 Decreased white blood cell count, unspecified: Principal | ICD-10-CM
